# Patient Record
Sex: Male | Born: 1995 | Race: White | Hispanic: No | Marital: Single | Smoking: Never smoker
Health system: Southern US, Community
[De-identification: ages and names within clinical notes are randomized; demographics above are authoritative.]

---
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
## 2017-04-10 ENCOUNTER — Emergency Department (HOSPITAL_COMMUNITY)
Admission: EM | Admit: 2017-04-10 | Discharge: 2017-04-11 | Disposition: A | Discharge status: Home / Self Care | Payer: BLUE CROSS/BLUE SHIELD | Attending: Emergency Medicine | Admitting: Emergency Medicine

## 2017-04-10 ENCOUNTER — Encounter (HOSPITAL_COMMUNITY): Payer: Self-pay | Admitting: Emergency Medicine

## 2017-04-10 ENCOUNTER — Emergency Department (HOSPITAL_COMMUNITY): Payer: BLUE CROSS/BLUE SHIELD

## 2017-04-10 DIAGNOSIS — W458XXA Other foreign body or object entering through skin, initial encounter: Secondary | ICD-10-CM | POA: Insufficient documentation

## 2017-04-10 DIAGNOSIS — Y998 Other external cause status: Secondary | ICD-10-CM | POA: Diagnosis not present

## 2017-04-10 DIAGNOSIS — S80851A Superficial foreign body, right lower leg, initial encounter: Secondary | ICD-10-CM | POA: Insufficient documentation

## 2017-04-10 DIAGNOSIS — Y9289 Other specified places as the place of occurrence of the external cause: Secondary | ICD-10-CM | POA: Insufficient documentation

## 2017-04-10 DIAGNOSIS — Y9301 Activity, walking, marching and hiking: Secondary | ICD-10-CM | POA: Insufficient documentation

## 2017-04-10 DIAGNOSIS — M79604 Pain in right leg: Secondary | ICD-10-CM | POA: Diagnosis present

## 2017-04-10 MED ORDER — LIDOCAINE HCL (PF) 1 % IJ SOLN
5.0000 mL | Freq: Once | INTRAMUSCULAR | Status: AC
  Administered 2017-04-10: 5 mL via INTRADERMAL

## 2017-04-10 MED ORDER — LIDOCAINE HCL (PF) 1 % IJ SOLN
INTRAMUSCULAR | Status: DC
Start: 2017-04-10 — End: 2017-04-11
  Filled 2017-04-10: qty 5

## 2017-04-10 NOTE — ED Notes (Signed)
Cleaned up wounds on patients lower leg.

## 2017-04-10 NOTE — ED Triage Notes (Signed)
Pt c/o right lower leg pain after falling through porch.

## 2017-04-10 NOTE — ED Notes (Signed)
Pt fell through a porch floor now complains of pain to leg

## 2017-04-10 NOTE — ED Notes (Signed)
Superficial abrasion to R shin Minimal swelling noted  GF in bed next to pt as mother states "for comfort"

## 2017-04-10 NOTE — Discharge Instructions (Signed)
Return if any problems.

## 2017-04-11 NOTE — ED Provider Notes (Signed)
[facility] Provider Note   CSN: [id number] Arrival date & time: 04/10/17  2142     History   Chief Complaint Chief Complaint  Patient presents with  . Leg Pain    HPI Kenneth Mcneil is a 21 y.o. male.  The history is provided by the patient. No language interpreter was used.  Leg Pain   This is a new problem. The current episode started 1 to 2 hours ago. The problem occurs constantly. The pain is present in the right lower leg. The quality of the pain is described as aching. The pain is mild. Pertinent negatives include full range of motion. He has tried nothing for the symptoms. The treatment provided no relief.   Pt fell through old wood on a porch.  Pt complains of pain in  History reviewed. No pertinent past medical history.  There are no active problems to display for this patient.   History reviewed. No pertinent surgical history.     Home Medications    Prior to Admission medications   Not on File    Family History No family history on file.  Social History Social History  Substance Use Topics  . Smoking status: Never Smoker  . Smokeless tobacco: Never Used  . Alcohol use No     Allergies   Patient has no known allergies.   Review of Systems Review of Systems  All other systems reviewed and are negative.    Physical Exam Updated Vital Signs BP (!) 115/57 (BP Location: Left Arm)   Pulse 65   Temp 98.2 F (36.8 C)   Resp 16   Ht 5\' 11"  (1.803 m)   Wt 68 kg (150 lb)   SpO2 100%   BMI 20.92 kg/m   Physical Exam  Constitutional: He appears well-developed and well-nourished.  HENT:  Head: Normocephalic.  Musculoskeletal: He exhibits tenderness.  Neurological: He is alert.  Skin:  Multiple abrasions lower right leg,  Dark splinter,    Psychiatric: He has a normal mood and affect.  Nursing note and vitals reviewed.    ED Treatments / Results  Labs (all labs ordered are listed, but only abnormal results are  displayed) Labs Reviewed - No data to display  EKG  EKG Interpretation None       Radiology Dg Tibia/fibula Right  Result Date: 04/10/2017 CLINICAL DATA:  Initial evaluation for acute trauma, laceration. EXAM: RIGHT TIBIA AND FIBULA - 2 VIEW COMPARISON:  None. FINDINGS: There is no evidence of fracture or other focal bone lesions. No appreciable soft tissue injury. No radiopaque foreign body. IMPRESSION: 1. No radiopaque foreign body identified. 2. No acute osseous abnormality. Electronically Signed   By: Rise MuBenjamin  McClintock M.D.   On: 04/10/2017 22:41    Procedures .Foreign Body Removal Date/Time: 04/11/2017 12:09 AM Performed by: Elson AreasSOFIA, LESLIE K Authorized by: Elson AreasSOFIA, LESLIE K  Consent: Verbal consent obtained. Time out: Immediately prior to procedure a "time out" was called to verify the correct patient, procedure, equipment, support staff and site/side marked as required. Body area: skin Anesthesia: local infiltration  Anesthesia: Local Anesthetic: lidocaine 1% without epinephrine  Sedation: Patient sedated: no 1 objects recovered. Objects recovered: 1 Post-procedure assessment: foreign body removed Comments: 6mm wood splinter removed,    (including critical care time)  Medications Ordered in ED Medications  lidocaine (PF) (XYLOCAINE) 1 % injection (not administered)  lidocaine (PF) (XYLOCAINE) 1 % injection 5 mL (5 mLs Intradermal Given 04/10/17 2349)     Initial Impression /  Assessment and Plan / ED Course  I have reviewed the triage vital signs and the nursing notes.  Pertinent labs & imaging results that were available during my care of the patient were reviewed by me and considered in my medical decision making (see chart for details).       Final Clinical Impressions(s) / ED Diagnoses   Final diagnoses:  Splinter of lower leg without major open wound, right, initial encounter    New Prescriptions New Prescriptions   No medications on file      Osie Cheeks 04/11/17 Bernadene Bell, MD 04/11/17 [phone]

## 2017-06-26 ENCOUNTER — Other Ambulatory Visit: Payer: Self-pay | Admitting: Otolaryngology

## 2017-06-26 DIAGNOSIS — H7012 Chronic mastoiditis, left ear: Secondary | ICD-10-CM

## 2017-07-20 ENCOUNTER — Ambulatory Visit
Admission: RE | Admit: 2017-07-20 | Discharge: 2017-07-20 | Disposition: A | Discharge status: Home / Self Care | Payer: BLUE CROSS/BLUE SHIELD | Source: Ambulatory Visit | Attending: Otolaryngology | Admitting: Otolaryngology

## 2017-07-20 DIAGNOSIS — H7012 Chronic mastoiditis, left ear: Secondary | ICD-10-CM

## 2017-07-20 MED ORDER — IOPAMIDOL (ISOVUE-300) INJECTION 61%
75.0000 mL | Freq: Once | INTRAVENOUS | Status: AC | PRN
  Administered 2017-07-20: 75 mL via INTRAVENOUS

## 2018-08-19 IMAGING — CT CT TEMPORAL BONES W/ CM
3 of 6 series · 17 of 30 positions shown, 19 images · IV contrast (75CC ISOVUE 300)
Comparison: CT temporal bone 07/29/2005.

CLINICAL DATA: Mastoiditis since birth. Decreased hearing in LEFT
ear.

EXAM:
CT TEMPORAL BONES WITH CONTRAST
TECHNIQUE: Axial and coronal plane CT imaging of the petrous temporal bones was
performed with thin-collimation image reconstruction after
intravenous contrast administration. Multiplanar CT image
reconstructions were also generated.
CONTRAST:  75mL WZE24R-R66 IOPAMIDOL (WZE24R-R66) INJECTION 61%

[Series 3: ax mag right · axial · 0.20mm/px · z∈[-42,+10]mm · 7 of 393 slices shown, 9 images]
[im 50/393  brain]
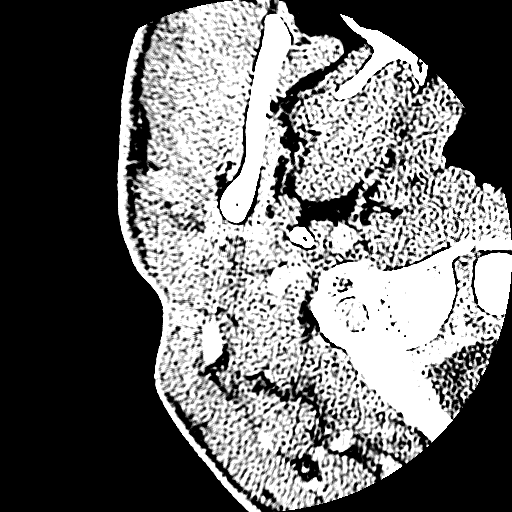
[im 50/393  bone]
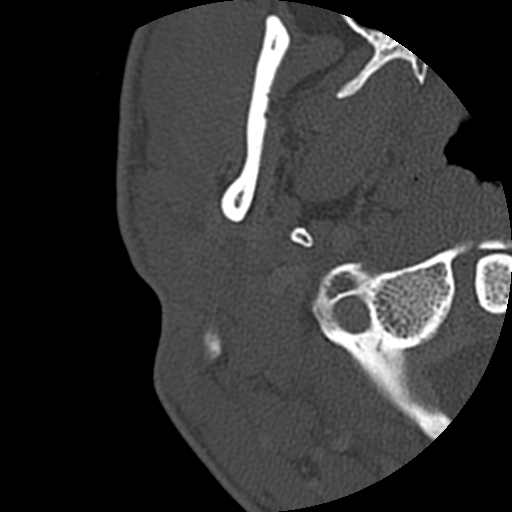
[im 99/393  bone]
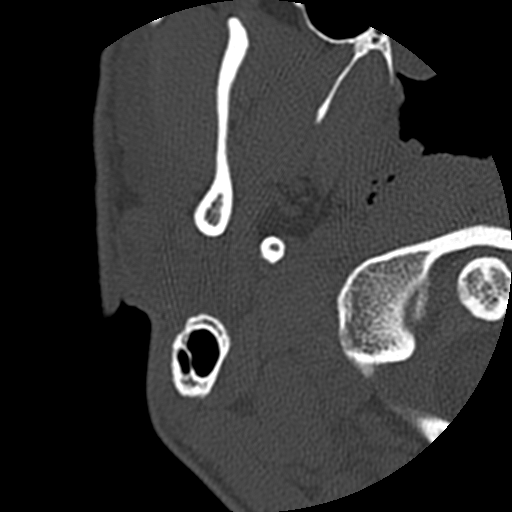
[im 148/393  bone]
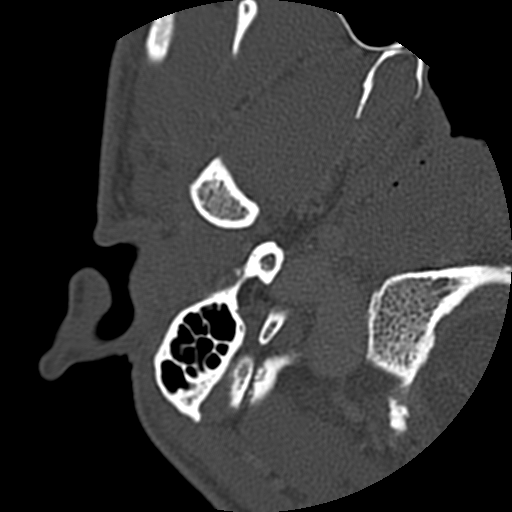
[im 197/393  bone]
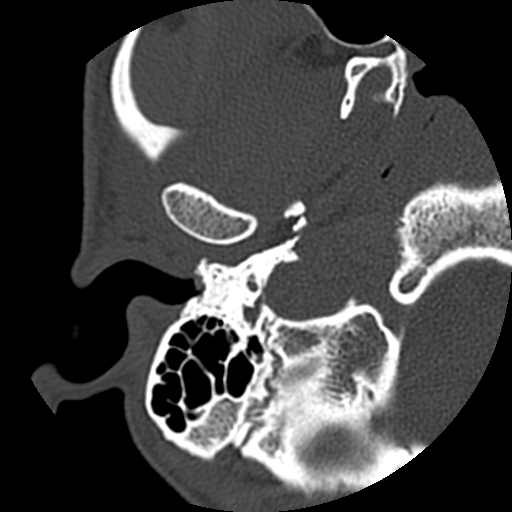
[im 246/393  brain]
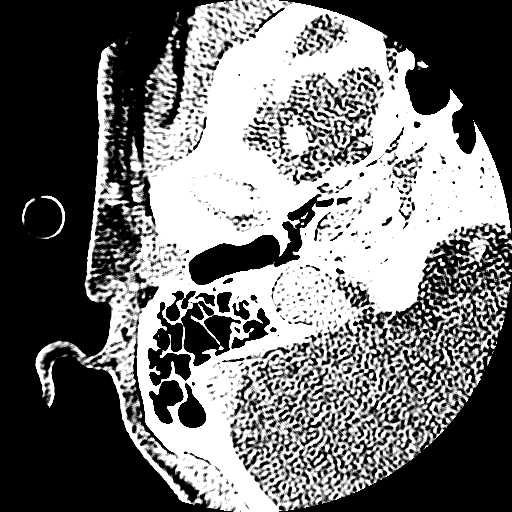
[im 246/393  bone]
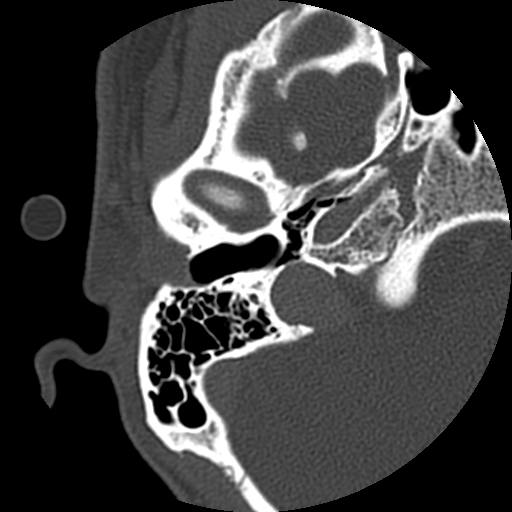
[im 295/393  bone]
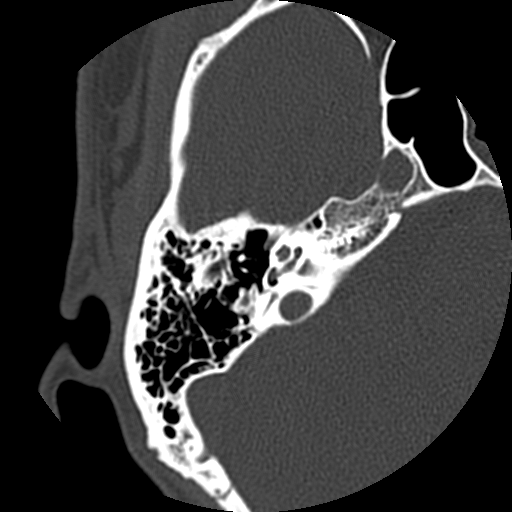
[im 344/393  bone]
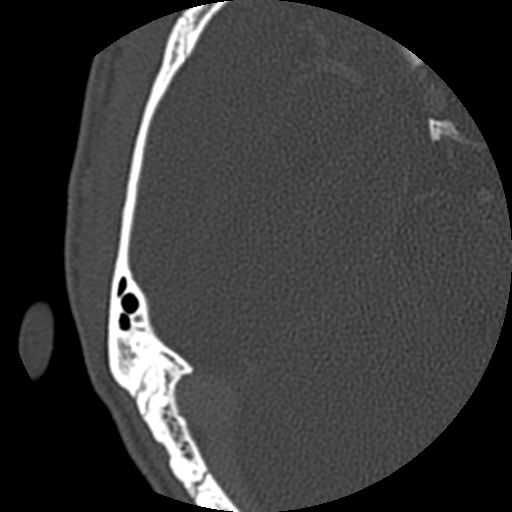

[Series 4: ax mag left · axial · 0.20mm/px · z∈[-40,+10]mm · 6 of 396 slices shown]
[im 57/396  bone]
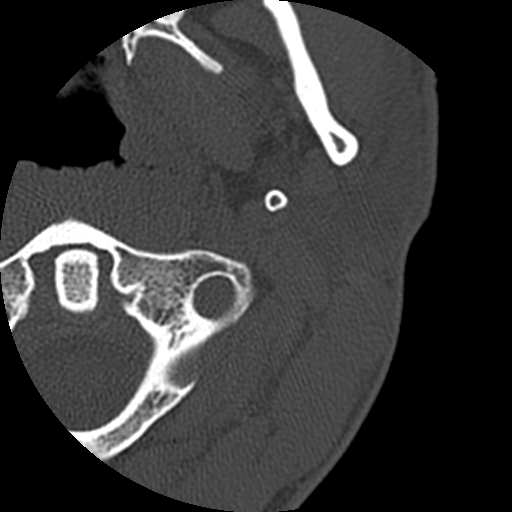
[im 113/396  bone]
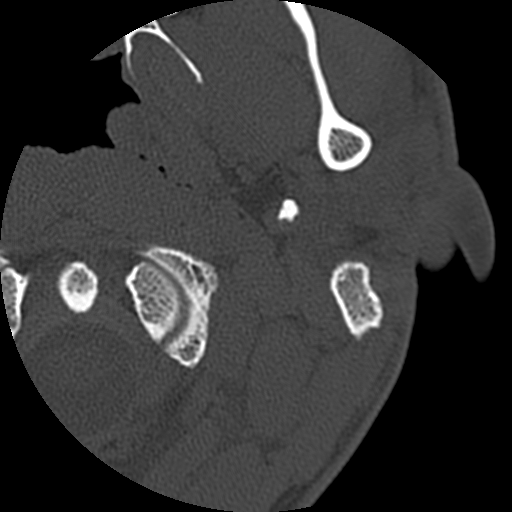
[im 170/396  bone]
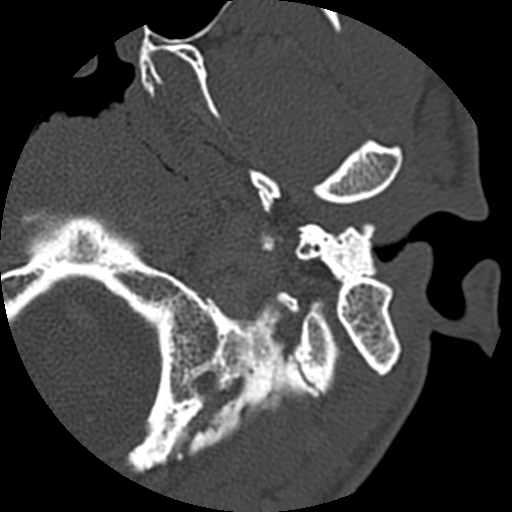
[im 226/396  bone]
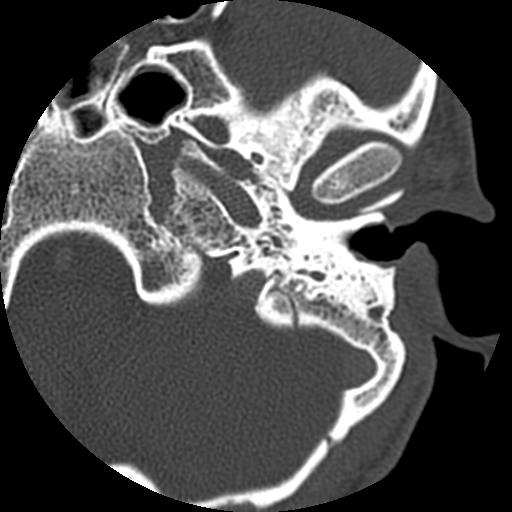
[im 283/396  bone]
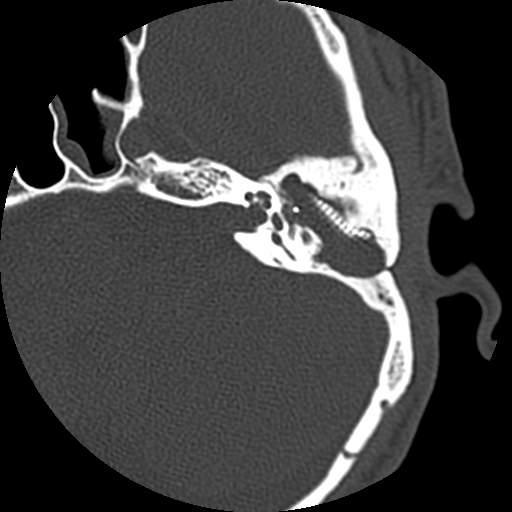
[im 339/396  bone]
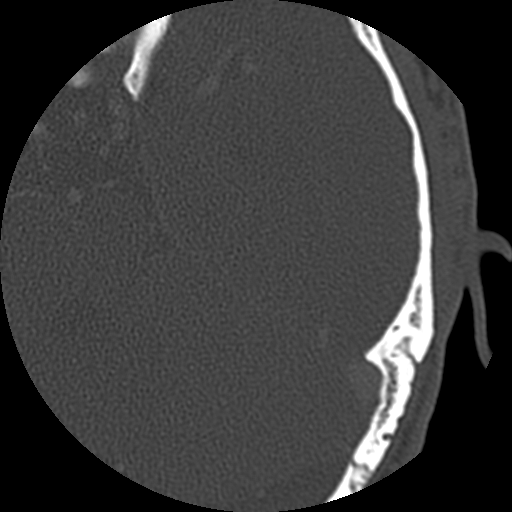

[Series 300: rt mag cor · coronal · 0.20mm/px · 4 of 312 slices shown]
[im 63/312  bone]
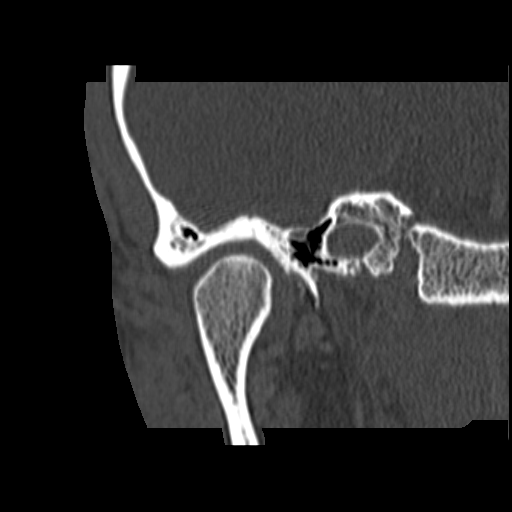
[im 125/312  bone]
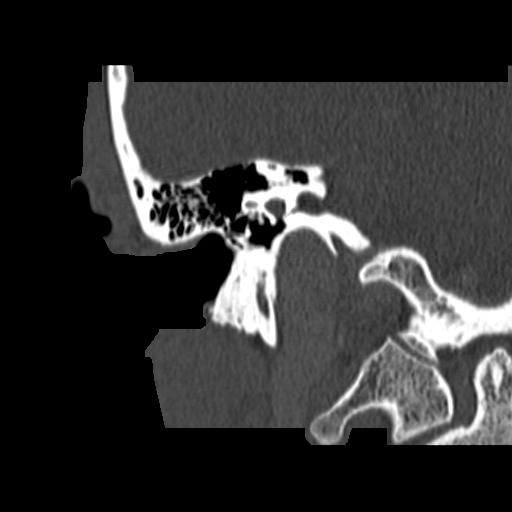
[im 187/312  bone]
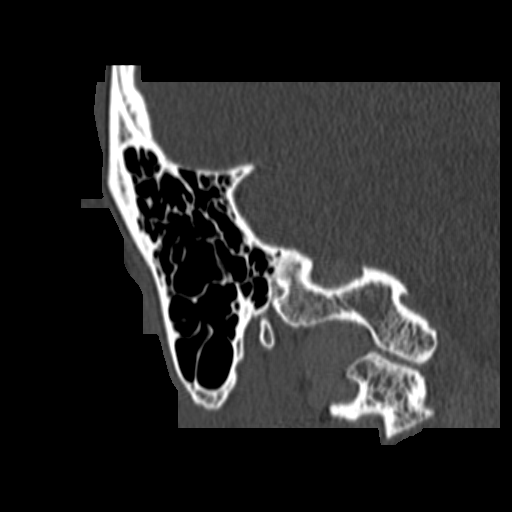
[im 249/312  bone]
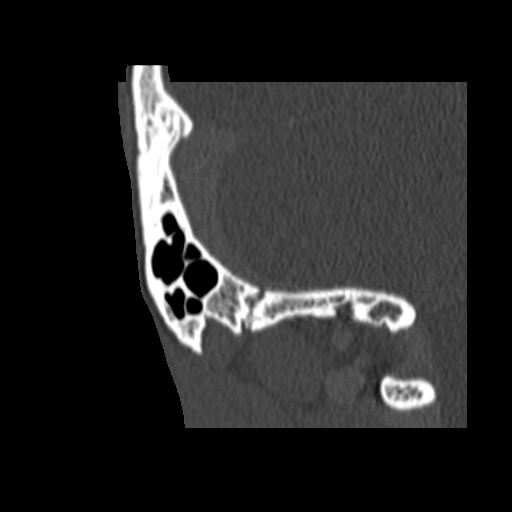

[17 of 30 positions shown; findings below may reference images not displayed]

FINDINGS: RIGHT ear is normal.

Extensive mastoidectomy defect throughout the LEFT ear filled with
fluid. Tegmen tympani and mastoideum intact. Ossicles are not
visualized. Metallic prosthesis bridges the defect LEFT by middle
ear debridement. There is adjacent soft tissue in the external
canal. Coalescence extends inferior and posterior to the cochlea and
vestibule, 4 x 6 mm cross-section. No definite labyrinthine
dehiscence. Coalescence in some residual LEFT mastoid air cells.

Visualized intracranial compartment unremarkable.
IMPRESSION: Extensive postsurgical change in the LEFT ear with residual fluid.
Concern for cholesteatoma extending medially towards the
labyrinthine structures, 4 x 6 mm. See discussion above.

Normal RIGHT ear.

## 2019-07-27 ENCOUNTER — Other Ambulatory Visit: Payer: Self-pay | Admitting: *Deleted

## 2019-07-27 DIAGNOSIS — Z20822 Contact with and (suspected) exposure to covid-19: Secondary | ICD-10-CM

## 2019-07-28 LAB — NOVEL CORONAVIRUS, NAA: SARS-CoV-2, NAA: NOT DETECTED

## 2019-08-17 ENCOUNTER — Other Ambulatory Visit: Payer: Self-pay

## 2019-08-17 DIAGNOSIS — Z20822 Contact with and (suspected) exposure to covid-19: Secondary | ICD-10-CM

## 2019-08-19 LAB — NOVEL CORONAVIRUS, NAA: SARS-CoV-2, NAA: NOT DETECTED

## 2019-08-23 ENCOUNTER — Telehealth: Payer: Self-pay | Admitting: *Deleted

## 2020-11-28 NOTE — Progress Notes (Signed)
Opened in error - please disregard

## 2021-04-23 ENCOUNTER — Ambulatory Visit: Payer: BLUE CROSS/BLUE SHIELD | Admitting: Urology

## 2021-05-06 ENCOUNTER — Ambulatory Visit: Payer: Self-pay | Admitting: Urology

## 2021-06-25 ENCOUNTER — Ambulatory Visit: Payer: Self-pay | Admitting: Urology

## 2021-07-12 ENCOUNTER — Other Ambulatory Visit: Payer: Self-pay

## 2021-07-12 ENCOUNTER — Encounter: Payer: Self-pay | Admitting: Urology

## 2021-07-12 ENCOUNTER — Ambulatory Visit (INDEPENDENT_AMBULATORY_CARE_PROVIDER_SITE_OTHER): Payer: BC Managed Care – PPO | Admitting: Urology

## 2021-07-12 VITALS — BP 142/85 | HR 74 | Temp 98.6°F | Wt 199.8 lb

## 2021-07-12 DIAGNOSIS — R310 Gross hematuria: Secondary | ICD-10-CM | POA: Diagnosis not present

## 2021-07-12 LAB — URINALYSIS, ROUTINE W REFLEX MICROSCOPIC
Bilirubin, UA: NEGATIVE
Glucose, UA: NEGATIVE
Leukocytes,UA: NEGATIVE
Nitrite, UA: NEGATIVE
Protein,UA: NEGATIVE
RBC, UA: NEGATIVE
Specific Gravity, UA: >1.03 — ABNORMAL HIGH (ref 1.005–1.030)
Urobilinogen, Ur: 0.2 mg/dL (ref 0.2–1.0)
pH, UA: 6 (ref 5.0–7.5)

## 2021-07-12 MED ORDER — SULFAMETHOXAZOLE-TRIMETHOPRIM 800-160 MG PO TABS: 1.0000 | ORAL_TABLET | Freq: Two times a day (BID) | ORAL | 0 refills | Status: AC

## 2021-07-12 NOTE — Progress Notes (Signed)
   07/12/2021 11:41 AM   Barbara Cower December 05, 1995 [id number]  Referring provider: Joette Catching, MD [address] [place],  [place] [address]  Gross hematuria   HPI: Mr Everett is 25yo here for evaluation of gross hematuria. In 03/2021 he had multiple episodes of painless gross hematuria with clots. No urinary frequency, urinary urgency or dysuria. No hx of prostate infections or UTIs. He had a second bought of gross hematuria with hematospermia 3 weeks ago which has since cleared. No tobacco abuse hx. He works for [employer]. No hx of nephrolithiasis.    PMH: No past medical history on file.  Surgical History: No past surgical history on file.  Home Medications:  Allergies as of 07/12/2021   No Known Allergies      Medication List    as of July 12, 2021 11:41 AM   You have not been prescribed any medications.     Allergies: No Known Allergies  Family History: No family history on file.  Social History:  reports that he has never smoked. He has never used smokeless tobacco. He reports that he does not drink alcohol and does not use drugs.  ROS: All other review of systems were reviewed and are negative except what is noted above in HPI  Physical Exam: BP (!) 142/85   Pulse 74   Temp 98.6 F (37 C)   Wt 199 lb 12.8 oz (90.6 kg)   BMI 27.87 kg/m   Constitutional:  Alert and oriented, No acute distress. HEENT: [place] AT, moist mucus membranes.  Trachea midline, no masses. Cardiovascular: No clubbing, cyanosis, or edema. Respiratory: Normal respiratory effort, no increased work of breathing. GI: Abdomen is soft, nontender, nondistended, no abdominal masses GU: No CVA tenderness.  Lymph: No cervical or inguinal lymphadenopathy. Skin: No rashes, bruises or suspicious lesions. Neurologic: Grossly intact, no focal deficits, moving all 4 extremities. Psychiatric: Normal mood and affect.  Laboratory Data: No results found for: WBC, HGB, HCT, MCV,  PLT  No results found for: CREATININE  No results found for: PSA  No results found for: TESTOSTERONE  No results found for: HGBA1C  Urinalysis No results found for: COLORURINE, APPEARANCEUR, LABSPEC, PHURINE, GLUCOSEU, HGBUR, BILIRUBINUR, KETONESUR, PROTEINUR, UROBILINOGEN, NITRITE, LEUKOCYTESUR  No results found for: LABMICR, WBCUA, RBCUA, LABEPIT, MUCUS, BACTERIA  Pertinent Imaging:  No results found for this or any previous visit.  No results found for this or any previous visit.  No results found for this or any previous visit.  No results found for this or any previous visit.  No results found for this or any previous visit.  No results found for this or any previous visit.  No results found for this or any previous visit.  No results found for this or any previous visit.   Assessment & Plan:    1. Gross hematuria -BMP -CT hematuria - Urinalysis, Routine w reflex microscopic   No follow-ups on file.  Wilkie Aye, MD  [facility] [place]

## 2021-07-12 NOTE — Progress Notes (Signed)
Urological Symptom Review  Patient is experiencing the following symptoms: Blood in urine   Review of Systems  Gastrointestinal (upper)  : Negative for upper GI symptoms  Gastrointestinal (lower) : Negative for lower GI symptoms  Constitutional : Negative for symptoms  Skin: Negative for skin symptoms  Eyes: Negative for eye symptoms  Ear/Nose/Throat : Negative for Ear/Nose/Throat symptoms  Hematologic/Lymphatic: Negative for Hematologic/Lymphatic symptoms  Cardiovascular : Negative for cardiovascular symptoms  Respiratory : Negative for respiratory symptoms  Endocrine: Negative for endocrine symptoms  Musculoskeletal: Negative for musculoskeletal symptoms  Neurological: Negative for neurological symptoms  Psychologic: Negative for psychiatric symptoms  

## 2021-07-12 NOTE — Patient Instructions (Signed)

## 2021-07-13 LAB — BASIC METABOLIC PANEL
BUN/Creatinine Ratio: 12 (ref 9–20)
BUN: 13 mg/dL (ref 6–20)
CO2: 21 mmol/L (ref 20–29)
Calcium: 9.7 mg/dL (ref 8.7–10.2)
Chloride: 108 mmol/L — ABNORMAL HIGH (ref 96–106)
Creatinine, Ser: 1.07 mg/dL (ref 0.76–1.27)
Glucose: 100 mg/dL — ABNORMAL HIGH (ref 70–99)
Potassium: 4.2 mmol/L (ref 3.5–5.2)
Sodium: 141 mmol/L (ref 134–144)
eGFR: 100 mL/min/{1.73_m2} (ref >59)

## 2021-08-07 ENCOUNTER — Encounter: Payer: Self-pay | Admitting: *Deleted

## 2021-08-28 ENCOUNTER — Ambulatory Visit: Payer: BC Managed Care – PPO | Admitting: Urology

## 2022-04-22 ENCOUNTER — Ambulatory Visit (INDEPENDENT_AMBULATORY_CARE_PROVIDER_SITE_OTHER): Payer: Self-pay | Admitting: Urology

## 2022-04-22 ENCOUNTER — Encounter: Payer: Self-pay | Admitting: Urology

## 2022-04-22 VITALS — BP 150/98 | HR 79

## 2022-04-22 DIAGNOSIS — R310 Gross hematuria: Secondary | ICD-10-CM

## 2022-04-22 LAB — URINALYSIS, ROUTINE W REFLEX MICROSCOPIC
Bilirubin, UA: NEGATIVE
Glucose, UA: NEGATIVE
Ketones, UA: NEGATIVE
Leukocytes,UA: NEGATIVE
Nitrite, UA: NEGATIVE
Protein,UA: NEGATIVE
RBC, UA: NEGATIVE
Specific Gravity, UA: 1.02 (ref 1.005–1.030)
Urobilinogen, Ur: 0.2 mg/dL (ref 0.2–1.0)
pH, UA: 6 (ref 5.0–7.5)

## 2022-04-22 NOTE — Progress Notes (Signed)
   04/22/2022 11:46 AM   Barbara Cower [birth year] [id number]  Referring provider: Joette Catching, MD [address] [place],  [place] [address]  Gross hematuria   HPI: Mr Quevedo is a 26yo here for followup for gross hematuria.  He has had 11-12 episodes of gross hematuria in the past 3 months. He was seen last year for hematuria and did not get his CT. He also has hematospermia for the past 6 months. He denies nay urinary frequency, urgency or dysuria. No other complaints today  PMH: No past medical history on file.  Surgical History: No past surgical history on file.  Home Medications:  Allergies as of 04/22/2022   No Known Allergies      Medication List        Accurate as of April 22, 2022 11:46 AM. If you have any questions, ask your nurse or doctor.          sulfamethoxazole-trimethoprim 800-160 MG tablet Commonly known as: BACTRIM DS Take 1 tablet by mouth every 12 (twelve) hours.        Allergies: No Known Allergies  Family History: No family history on file.  Social History:  reports that he has never smoked. He has never used smokeless tobacco. He reports that he does not drink alcohol and does not use drugs.  ROS: All other review of systems were reviewed and are negative except what is noted above in HPI  Physical Exam: BP (!) 150/98   Pulse 79   Constitutional:  Alert and oriented, No acute distress. HEENT: [place] AT, moist mucus membranes.  Trachea midline, no masses. Cardiovascular: No clubbing, cyanosis, or edema. Respiratory: Normal respiratory effort, no increased work of breathing. GI: Abdomen is soft, nontender, nondistended, no abdominal masses GU: No CVA tenderness.  Lymph: No cervical or inguinal lymphadenopathy. Skin: No rashes, bruises or suspicious lesions. Neurologic: Grossly intact, no focal deficits, moving all 4 extremities. Psychiatric: Normal mood and affect.  Laboratory Data: No results found for: "WBC", "HGB", "HCT",  "MCV", "PLT"  Lab Results  Component Value Date   CREATININE 1.07 07/12/2021    No results found for: "PSA"  No results found for: "TESTOSTERONE"  No results found for: "HGBA1C"  Urinalysis    Component Value Date/Time   APPEARANCEUR Clear 07/12/2021 1150   GLUCOSEU Negative 07/12/2021 1150   BILIRUBINUR Negative 07/12/2021 1150   PROTEINUR Negative 07/12/2021 1150   NITRITE Negative 07/12/2021 1150   LEUKOCYTESUR Negative 07/12/2021 1150    Lab Results  Component Value Date   LABMICR Comment 07/12/2021    Pertinent Imaging:  No results found for this or any previous visit.  No results found for this or any previous visit.  No results found for this or any previous visit.  No results found for this or any previous visit.  No results found for this or any previous visit.  No results found for this or any previous visit.  No results found for this or any previous visit.  No results found for this or any previous visit.   Assessment & Plan:    1. Gross hematuria BMP CT Hematuria  - Urinalysis, Routine w reflex microscopic   No follow-ups on file.  Wilkie Aye, MD  [facility] [place]

## 2022-04-22 NOTE — Patient Instructions (Signed)

## 2022-04-23 LAB — BASIC METABOLIC PANEL
BUN/Creatinine Ratio: 9 (ref 9–20)
BUN: 9 mg/dL (ref 6–20)
CO2: 24 mmol/L (ref 20–29)
Calcium: 9.8 mg/dL (ref 8.7–10.2)
Chloride: 100 mmol/L (ref 96–106)
Creatinine, Ser: 0.97 mg/dL (ref 0.76–1.27)
Glucose: 91 mg/dL (ref 70–99)
Potassium: 4 mmol/L (ref 3.5–5.2)
Sodium: 138 mmol/L (ref 134–144)
eGFR: 112 mL/min/{1.73_m2} (ref >59)

## 2022-05-28 ENCOUNTER — Ambulatory Visit (HOSPITAL_COMMUNITY): Admission: RE | Admit: 2022-05-28 | Payer: Self-pay | Source: Ambulatory Visit

## 2022-05-30 ENCOUNTER — Ambulatory Visit: Payer: Self-pay | Admitting: Urology

## 2022-06-17 ENCOUNTER — Ambulatory Visit (HOSPITAL_COMMUNITY)
Admission: RE | Admit: 2022-06-17 | Discharge: 2022-06-17 | Disposition: A | Discharge status: Home / Self Care | Payer: BC Managed Care – PPO | Source: Ambulatory Visit | Attending: Urology | Admitting: Urology

## 2022-06-17 DIAGNOSIS — R310 Gross hematuria: Secondary | ICD-10-CM | POA: Diagnosis not present

## 2022-06-17 LAB — POCT I-STAT CREATININE: Creatinine, Ser: 1 mg/dL (ref 0.61–1.24)

## 2022-06-17 MED ORDER — IOHEXOL 300 MG/ML  SOLN
100.0000 mL | Freq: Once | INTRAMUSCULAR | Status: AC | PRN
  Administered 2022-06-17: 100 mL via INTRAVENOUS

## 2022-07-08 NOTE — Progress Notes (Signed)
Letter sent.

## 2022-12-01 ENCOUNTER — Telehealth: Payer: Self-pay

## 2022-12-01 ENCOUNTER — Other Ambulatory Visit: Payer: Self-pay

## 2022-12-01 DIAGNOSIS — R310 Gross hematuria: Secondary | ICD-10-CM

## 2022-12-01 DIAGNOSIS — R339 Retention of urine, unspecified: Secondary | ICD-10-CM

## 2022-12-01 MED ORDER — TAMSULOSIN HCL 0.4 MG PO CAPS: 0.4000 mg | ORAL_CAPSULE | Freq: Every day | ORAL | 3 refills | Status: AC

## 2022-12-01 NOTE — Telephone Encounter (Signed)
Patient called and states that he do a lot of heavy lifting at work and need a doctors note to be out of work all week until catheter is remove. Patient is aware work note will be upfront. Patient voiced understanding.

## 2022-12-01 NOTE — Telephone Encounter (Signed)
Patient is aware to take flomax once a day and patient is put on nurse visit for voiding trial. Patient voiced understanding.

## 2022-12-01 NOTE — Telephone Encounter (Signed)
Patient called in today to voiced that he had to have a catheter put in a [facility] for urinary retention and gross hematuria. Patient stated that he was informed to contact a urologist to discuss removal of catheter. Patient is aware that the catheter can stay in for up to 4 weeks, let patient know that I will have the provider pull his records from [facility] on the next steps. Patient is aware that someone will reach back out to him, patient voiced understanding. Verbal for Dr. Alyson Ingles to put patient on Flomax and 1 week nurse visit for voiding trial.

## 2022-12-08 ENCOUNTER — Ambulatory Visit (INDEPENDENT_AMBULATORY_CARE_PROVIDER_SITE_OTHER): Payer: BC Managed Care – PPO | Admitting: Urology

## 2022-12-08 DIAGNOSIS — R310 Gross hematuria: Secondary | ICD-10-CM

## 2022-12-08 DIAGNOSIS — R339 Retention of urine, unspecified: Secondary | ICD-10-CM

## 2022-12-08 NOTE — Progress Notes (Signed)
Fill and Pull Catheter Removal  Patient is present today for a catheter removal.  Patient was cleaned and prepped in a sterile fashion 490ml of sterile water/ saline was instilled into the bladder when the patient felt the urge to urinate. 24ml of water was then drained from the balloon.  A 14FR foley cath was removed from the bladder no complications were noted .  Patient as then given some time to void on their own.  Patient can void  554ml on their own after some time.  Patient tolerated well.  Performed by: Travoris Bushey LPN  Follow up/ Additional notes: keep scheduled 1 month f/u ov

## 2023-01-21 ENCOUNTER — Ambulatory Visit: Payer: BC Managed Care – PPO | Admitting: Urology
